# Patient Record
Sex: Male | Born: 1990 | Race: Black or African American | Hispanic: No | Marital: Single | Smoking: Current some day smoker
Health system: Southern US, Community
[De-identification: ages and names within clinical notes are randomized; demographics above are authoritative.]

---
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
## 2012-01-02 LAB — TB SKIN TEST

## 2015-05-29 ENCOUNTER — Ambulatory Visit (INDEPENDENT_AMBULATORY_CARE_PROVIDER_SITE_OTHER): Payer: Managed Care, Other (non HMO) | Admitting: Physician Assistant

## 2015-05-29 VITALS — BP 120/80 | HR 82 | Temp 98.8°F | Resp 16 | Ht 68.0 in | Wt 136.0 lb

## 2015-05-29 DIAGNOSIS — M25562 Pain in left knee: Secondary | ICD-10-CM

## 2015-05-29 DIAGNOSIS — M545 Low back pain, unspecified: Secondary | ICD-10-CM

## 2015-05-29 DIAGNOSIS — M25561 Pain in right knee: Secondary | ICD-10-CM

## 2015-05-29 NOTE — Progress Notes (Signed)
   Jeffery James  MRN: [id number] DOB: 1991-02-13  Subjective:  Pt presents to clinic with low back pain and bilateral knee pain.  He has had problems with his back and knees for a while but now he wants to make sure everything is ok.  He has never been seen for this before.  Yesterday he tried some tylenol yesterday and it did seem to help.  The pain is worse after he has worked but he wakes up without pain.  He is having no pain radiation and no paresthesias.  Over the last several days he has had some stomach cramping but it is intermittent and not related to anything including eating.  He is having no problems with constipation or gas.  Works in a [employer] and stands for 8h a day. - he has worked 13d in a row - not a lot of heavy lifting- he was supposed to work today and be off tomorrow but he felt like he needed  days to rest so he did not go to work today.  There are no active problems to display for this patient.   No current outpatient prescriptions on file prior to visit.   No current facility-administered medications on file prior to visit.    No Known Allergies  Review of Systems  Constitutional: Negative for fever and chills.  Musculoskeletal: Positive for back pain. Negative for gait problem.   Objective:  BP 120/80 mmHg  Pulse 82  Temp(Src) 98.8 F (37.1 C) (Oral)  Resp 16  Ht  (1.727 m)  Wt 136 lb (61.689 kg)  BMI 20.68 kg/m2  SpO2 98%  Physical Exam  Constitutional: He is oriented to person, place, and time and well-developed, well-nourished, and in no distress.  HENT:  Head: Normocephalic and atraumatic.  Right Ear: External ear normal.  Left Ear: External ear normal.  Eyes: Conjunctivae are normal.  Neck: Normal range of motion.  Pulmonary/Chest: Effort normal.  Abdominal: Soft. Normal appearance. There is no tenderness. There is no CVA tenderness.  Musculoskeletal:       Right knee: Normal.       Left knee: Normal.       Lumbar back: He  exhibits normal range of motion, no bony tenderness, no pain and no spasm.  Tight hamstrings  Neurological: He is alert and oriented to person, place, and time. He has normal sensation, normal strength and normal reflexes. He displays no weakness and normal reflexes. He has a normal Straight Leg Raise Test. Gait normal. Gait normal.  Skin: Skin is warm and dry.  Psychiatric: Mood, memory, affect and judgment normal.    Assessment and Plan :  Bilateral low back pain without sciatica  Bilateral knee pain   Pt has tight hamstrings but an otherwise normal exam.  We talked about good back health and ways to protect his back at work.  He will try and stretch his hamstrings to help protect his back.  I suspect that his abd discomfort is possibly related to a viral illness but he will be mindful of healthy eating and enough fluids and he will use a probiotic to help.  Benny Lennert PA-C  [facility] 05/29/2015 3:04 PM

## 2015-05-29 NOTE — Patient Instructions (Signed)
Probiotics to help with GI upset - watch milk products for the next week or so as this can give you some stomach upset after a possible stomach illness.  Hamstring stretches like we talked about to help protect your back  Low Back Sprain With Rehab A sprain is an injury in which a ligament is torn. The ligaments of the lower back are vulnerable to sprains. However, they are strong and require great force to be injured. These ligaments are important for stabilizing the spinal column. Sprains are classified into three categories. Grade 1 sprains cause pain, but the tendon is not lengthened. Grade 2 sprains include a lengthened ligament, due to the ligament being stretched or partially ruptured. With grade 2 sprains there is still function, although the function may be decreased. Grade 3 sprains involve a complete tear of the tendon or muscle, and function is usually impaired. SYMPTOMS   Severe pain in the lower back.  Sometimes, a feeling of a "pop," "snap," or tear, at the time of injury.  Tenderness and sometimes swelling at the injury site.  Uncommonly, bruising (contusion) within 48 hours of injury.  Muscle spasms in the back. CAUSES  Low back sprains occur when a force is placed on the ligaments that is greater than they can handle. Common causes of injury include:  Performing a stressful act while off-balance.  Repetitive stressful activities that involve movement of the lower back.  Direct hit (trauma) to the lower back. RISK INCREASES WITH:  Contact sports (football, wrestling).  Collisions (major skiing accidents).  Sports that require throwing or lifting (baseball, weightlifting).  Sports involving twisting of the spine (gymnastics, diving, tennis, golf).  Poor strength and flexibility.  Inadequate protection.  Previous back injury or surgery (especially fusion). PREVENTION  Wear properly fitted and padded protective equipment.  Warm up and stretch properly before  activity.  Allow for adequate recovery between workouts.  Maintain physical fitness:  Strength, flexibility, and endurance.  Cardiovascular fitness.  Maintain a healthy body weight. PROGNOSIS  If treated properly, low back sprains usually heal with non-surgical treatment. The length of time for healing depends on the severity of the injury.  RELATED COMPLICATIONS   Recurring symptoms, resulting in a chronic problem.  Chronic inflammation and pain in the low back.  Delayed healing or resolution of symptoms, especially if activity is resumed too soon.  Prolonged impairment.  Unstable or arthritic joints of the low back. TREATMENT  Treatment first involves the use of ice and medicine, to reduce pain and inflammation. The use of strengthening and stretching exercises may help reduce pain with activity. These exercises may be performed at home or with a therapist. Severe injuries may require referral to a therapist for further evaluation and treatment, such as ultrasound. Your caregiver may advise that you wear a back brace or corset, to help reduce pain and discomfort. Often, prolonged bed rest results in greater harm then benefit. Corticosteroid injections may be recommended. However, these should be reserved for the most serious cases. It is important to avoid using your back when lifting objects. At night, sleep on your back on a firm mattress, with a pillow placed under your knees. If non-surgical treatment is unsuccessful, surgery may be needed.  MEDICATION   If pain medicine is needed, nonsteroidal anti-inflammatory medicines (aspirin and ibuprofen), or other minor pain relievers (acetaminophen), are often advised.  Do not take pain medicine for 7 days before surgery.  Prescription pain relievers may be given, if your caregiver thinks they  are needed. Use only as directed and only as much as you need.  Ointments applied to the skin may be helpful.  Corticosteroid injections may  be given by your caregiver. These injections should be reserved for the most serious cases, because they may only be given a certain number of times. HEAT AND COLD  Cold treatment (icing) should be applied for 10 to 15 minutes every 2 to 3 hours for inflammation and pain, and immediately after activity that aggravates your symptoms. Use ice packs or an ice massage.  Heat treatment may be used before performing stretching and strengthening activities prescribed by your caregiver, physical therapist, or athletic trainer. Use a heat pack or a warm water soak. SEEK MEDICAL CARE IF:   Symptoms get worse or do not improve in 2 to 4 weeks, despite treatment.  You develop numbness or weakness in either leg.  You lose bowel or bladder function.  Any of the following occur after surgery: fever, increased pain, swelling, redness, drainage of fluids, or bleeding in the affected area.  New, unexplained symptoms develop. (Drugs used in treatment may produce side effects.) EXERCISES  RANGE OF MOTION (ROM) AND STRETCHING EXERCISES - Low Back Sprain Most people with lower back pain will find that their symptoms get worse with excessive bending forward (flexion) or arching at the lower back (extension). The exercises that will help resolve your symptoms will focus on the opposite motion.  Your physician, physical therapist or athletic trainer will help you determine which exercises will be most helpful to resolve your lower back pain. Do not complete any exercises without first consulting with your caregiver. Discontinue any exercises which make your symptoms worse, until you speak to your caregiver. If you have pain, numbness or tingling which travels down into your buttocks, leg or foot, the goal of the therapy is for these symptoms to move closer to your back and eventually resolve. Sometimes, these leg symptoms will get better, but your lower back pain may worsen. This is often an indication of progress in  your rehabilitation. Be very alert to any changes in your symptoms and the activities in which you participated in the 24 hours prior to the change. Sharing this information with your caregiver will allow him or her to most efficiently treat your condition. These exercises may help you when beginning to rehabilitate your injury. Your symptoms may resolve with or without further involvement from your physician, physical therapist or athletic trainer. While completing these exercises, remember:   Restoring tissue flexibility helps normal motion to return to the joints. This allows healthier, less painful movement and activity.  An effective stretch should be held for at least 30 seconds.  A stretch should never be painful. You should only feel a gentle lengthening or release in the stretched tissue. FLEXION RANGE OF MOTION AND STRETCHING EXERCISES: STRETCH - Flexion, Single Knee to Chest   Lie on a firm bed or floor with both legs extended in front of you.  Keeping one leg in contact with the floor, bring your opposite knee to your chest. Hold your leg in place by either grabbing behind your thigh or at your knee.  Pull until you feel a gentle stretch in your low back. Hold __________ seconds.  Slowly release your grasp and repeat the exercise with the opposite side. Repeat __________ times. Complete this exercise __________ times per day.  STRETCH - Flexion, Double Knee to Chest  Lie on a firm bed or floor with both legs extended  in front of you.  Keeping one leg in contact with the floor, bring your opposite knee to your chest.  Tense your stomach muscles to support your back and then lift your other knee to your chest. Hold your legs in place by either grabbing behind your thighs or at your knees.  Pull both knees toward your chest until you feel a gentle stretch in your low back. Hold __________ seconds.  Tense your stomach muscles and slowly return one leg at a time to the  floor. Repeat __________ times. Complete this exercise __________ times per day.  STRETCH - Low Trunk Rotation  Lie on a firm bed or floor. Keeping your legs in front of you, bend your knees so they are both pointed toward the ceiling and your feet are flat on the floor.  Extend your arms out to the side. This will stabilize your upper body by keeping your shoulders in contact with the floor.  Gently and slowly drop both knees together to one side until you feel a gentle stretch in your low back. Hold for __________ seconds.  Tense your stomach muscles to support your lower back as you bring your knees back to the starting position. Repeat the exercise to the other side. Repeat __________ times. Complete this exercise __________ times per day  EXTENSION RANGE OF MOTION AND FLEXIBILITY EXERCISES: STRETCH - Extension, Prone on Elbows   Lie on your stomach on the floor, a bed will be too soft. Place your palms about shoulder width apart and at the height of your head.  Place your elbows under your shoulders. If this is too painful, stack pillows under your chest.  Allow your body to relax so that your hips drop lower and make contact more completely with the floor.  Hold this position for __________ seconds.  Slowly return to lying flat on the floor. Repeat __________ times. Complete this exercise __________ times per day.  RANGE OF MOTION - Extension, Prone Press Ups  Lie on your stomach on the floor, a bed will be too soft. Place your palms about shoulder width apart and at the height of your head.  Keeping your back as relaxed as possible, slowly straighten your elbows while keeping your hips on the floor. You may adjust the placement of your hands to maximize your comfort. As you gain motion, your hands will come more underneath your shoulders.  Hold this position __________ seconds.  Slowly return to lying flat on the floor. Repeat __________ times. Complete this exercise  __________ times per day.  RANGE OF MOTION- Quadruped, Neutral Spine   Assume a hands and knees position on a firm surface. Keep your hands under your shoulders and your knees under your hips. You may place padding under your knees for comfort.  Drop your head and point your tailbone toward the ground below you. This will round out your lower back like an angry cat. Hold this position for __________ seconds.  Slowly lift your head and release your tail bone so that your back sags into a large arch, like an old horse.  Hold this position for __________ seconds.  Repeat this until you feel limber in your low back.  Now, find your "sweet spot." This will be the most comfortable position somewhere between the two previous positions. This is your neutral spine. Once you have found this position, tense your stomach muscles to support your low back.  Hold this position for __________ seconds. Repeat __________ times. Complete this exercise  __________ times per day.  STRENGTHENING EXERCISES - Low Back Sprain These exercises may help you when beginning to rehabilitate your injury. These exercises should be done near your "sweet spot." This is the neutral, low-back arch, somewhere between fully rounded and fully arched, that is your least painful position. When performed in this safe range of motion, these exercises can be used for people who have either a flexion or extension based injury. These exercises may resolve your symptoms with or without further involvement from your physician, physical therapist or athletic trainer. While completing these exercises, remember:   Muscles can gain both the endurance and the strength needed for everyday activities through controlled exercises.  Complete these exercises as instructed by your physician, physical therapist or athletic trainer. Increase the resistance and repetitions only as guided.  You may experience muscle soreness or fatigue, but the pain or  discomfort you are trying to eliminate should never worsen during these exercises. If this pain does worsen, stop and make certain you are following the directions exactly. If the pain is still present after adjustments, discontinue the exercise until you can discuss the trouble with your caregiver. STRENGTHENING - Deep Abdominals, Pelvic Tilt   Lie on a firm bed or floor. Keeping your legs in front of you, bend your knees so they are both pointed toward the ceiling and your feet are flat on the floor.  Tense your lower abdominal muscles to press your low back into the floor. This motion will rotate your pelvis so that your tail bone is scooping upwards rather than pointing at your feet or into the floor. With a gentle tension and even breathing, hold this position for __________ seconds. Repeat __________ times. Complete this exercise __________ times per day.  STRENGTHENING - Abdominals, Crunches   Lie on a firm bed or floor. Keeping your legs in front of you, bend your knees so they are both pointed toward the ceiling and your feet are flat on the floor. Cross your arms over your chest.  Slightly tip your chin down without bending your neck.  Tense your abdominals and slowly lift your trunk high enough to just clear your shoulder blades. Lifting higher can put excessive stress on the lower back and does not further strengthen your abdominal muscles.  Control your return to the starting position. Repeat __________ times. Complete this exercise __________ times per day.  STRENGTHENING - Quadruped, Opposite UE/LE Lift   Assume a hands and knees position on a firm surface. Keep your hands under your shoulders and your knees under your hips. You may place padding under your knees for comfort.  Find your neutral spine and gently tense your abdominal muscles so that you can maintain this position. Your shoulders and hips should form a rectangle that is parallel with the floor and is not  twisted.  Keeping your trunk steady, lift your right hand no higher than your shoulder and then your left leg no higher than your hip. Make sure you are not holding your breath. Hold this position for __________ seconds.  Continuing to keep your abdominal muscles tense and your back steady, slowly return to your starting position. Repeat with the opposite arm and leg. Repeat __________ times. Complete this exercise __________ times per day.  STRENGTHENING - Abdominals and Quadriceps, Straight Leg Raise   Lie on a firm bed or floor with both legs extended in front of you.  Keeping one leg in contact with the floor, bend the other knee so that  your foot can rest flat on the floor.  Find your neutral spine, and tense your abdominal muscles to maintain your spinal position throughout the exercise.  Slowly lift your straight leg off the floor about 6 inches for a count of 15, making sure to not hold your breath.  Still keeping your neutral spine, slowly lower your leg all the way to the floor. Repeat this exercise with each leg __________ times. Complete this exercise __________ times per day. POSTURE AND BODY MECHANICS CONSIDERATIONS - Low Back Sprain Keeping correct posture when sitting, standing or completing your activities will reduce the stress put on different body tissues, allowing injured tissues a chance to heal and limiting painful experiences. The following are general guidelines for improved posture. Your physician or physical therapist will provide you with any instructions specific to your needs. While reading these guidelines, remember:  The exercises prescribed by your provider will help you have the flexibility and strength to maintain correct postures.  The correct posture provides the best environment for your joints to work. All of your joints have less wear and tear when properly supported by a spine with good posture. This means you will experience a healthier, less painful  body.  Correct posture must be practiced with all of your activities, especially prolonged sitting and standing. Correct posture is as important when doing repetitive low-stress activities (typing) as it is when doing a single heavy-load activity (lifting). RESTING POSITIONS Consider which positions are most painful for you when choosing a resting position. If you have pain with flexion-based activities (sitting, bending, stooping, squatting), choose a position that allows you to rest in a less flexed posture. You would want to avoid curling into a fetal position on your side. If your pain worsens with extension-based activities (prolonged standing, working overhead), avoid resting in an extended position such as sleeping on your stomach. Most people will find more comfort when they rest with their spine in a more neutral position, neither too rounded nor too arched. Lying on a non-sagging bed on your side with a pillow between your knees, or on your back with a pillow under your knees will often provide some relief. Keep in mind, being in any one position for a prolonged period of time, no matter how correct your posture, can still lead to stiffness. PROPER SITTING POSTURE In order to minimize stress and discomfort on your spine, you must sit with correct posture. Sitting with good posture should be effortless for a healthy body. Returning to good posture is a gradual process. Many people can work toward this most comfortably by using various supports until they have the flexibility and strength to maintain this posture on their own. When sitting with proper posture, your ears will fall over your shoulders and your shoulders will fall over your hips. You should use the back of the chair to support your upper back. Your lower back will be in a neutral position, just slightly arched. You may place a small pillow or folded towel at the base of your lower back for  support.  When working at a desk, create an  environment that supports good, upright posture. Without extra support, muscles tire, which leads to excessive strain on joints and other tissues. Keep these recommendations in mind: CHAIR:  A chair should be able to slide under your desk when your back makes contact with the back of the chair. This allows you to work closely.  The chair's height should allow your eyes to be  level with the upper part of your monitor and your hands to be slightly lower than your elbows. BODY POSITION  Your feet should make contact with the floor. If this is not possible, use a foot rest.  Keep your ears over your shoulders. This will reduce stress on your neck and low back. INCORRECT SITTING POSTURES  If you are feeling tired and unable to assume a healthy sitting posture, do not slouch or slump. This puts excessive strain on your back tissues, causing more damage and pain. Healthier options include:  Using more support, like a lumbar pillow.  Switching tasks to something that requires you to be upright or walking.  Talking a brief walk.  Lying down to rest in a neutral-spine position. PROLONGED STANDING WHILE SLIGHTLY LEANING FORWARD  When completing a task that requires you to lean forward while standing in one place for a long time, place either foot up on a stationary 2-4 inch high object to help maintain the best posture. When both feet are on the ground, the lower back tends to lose its slight inward curve. If this curve flattens (or becomes too large), then the back and your other joints will experience too much stress, tire more quickly, and can cause pain. CORRECT STANDING POSTURES Proper standing posture should be assumed with all daily activities, even if they only take a few moments, like when brushing your teeth. As in sitting, your ears should fall over your shoulders and your shoulders should fall over your hips. You should keep a slight tension in your abdominal muscles to brace your spine.  Your tailbone should point down to the ground, not behind your body, resulting in an over-extended swayback posture.  INCORRECT STANDING POSTURES  Common incorrect standing postures include a forward head, locked knees and/or an excessive swayback. WALKING Walk with an upright posture. Your ears, shoulders and hips should all line-up. PROLONGED ACTIVITY IN A FLEXED POSITION When completing a task that requires you to bend forward at your waist or lean over a low surface, try to find a way to stabilize 3 out of 4 of your limbs. You can place a hand or elbow on your thigh or rest a knee on the surface you are reaching across. This will provide you more stability, so that your muscles do not tire as quickly. By keeping your knees relaxed, or slightly bent, you will also reduce stress across your lower back. CORRECT LIFTING TECHNIQUES DO :  Assume a wide stance. This will provide you more stability and the opportunity to get as close as possible to the object which you are lifting.  Tense your abdominals to brace your spine. Bend at the knees and hips. Keeping your back locked in a neutral-spine position, lift using your leg muscles. Lift with your legs, keeping your back straight.  Test the weight of unknown objects before attempting to lift them.  Try to keep your elbows locked down at your sides in order get the best strength from your shoulders when carrying an object.  Always ask for help when lifting heavy or awkward objects. INCORRECT LIFTING TECHNIQUES DO NOT:   Lock your knees when lifting, even if it is a small object.  Bend and twist. Pivot at your feet or move your feet when needing to change directions.  Assume that you can safely pick up even a paperclip without proper posture.   This information is not intended to replace advice given to you by your health care provider. Make  sure you discuss any questions you have with your health care provider.   Document Released:  04/24/2005 Document Revised: 05/15/2014 Document Reviewed: 08/06/2008 [employer] Patient Education [employer]

## 2015-06-10 ENCOUNTER — Ambulatory Visit (INDEPENDENT_AMBULATORY_CARE_PROVIDER_SITE_OTHER): Payer: Managed Care, Other (non HMO) | Admitting: Family Medicine

## 2015-06-10 VITALS — BP 118/68 | HR 95 | Temp 98.2°F | Resp 18 | Ht 70.0 in | Wt 130.4 lb

## 2015-06-10 DIAGNOSIS — K529 Noninfective gastroenteritis and colitis, unspecified: Secondary | ICD-10-CM

## 2015-06-10 NOTE — Patient Instructions (Signed)
No further testing needed today as you are improving, but if fever or worsening symptoms, return for recheck. Make sure you're drinking plenty of fluids, and bland foods for the next day or 2, then increase diet as tolerated.  Return to the clinic or go to the nearest emergency room if any of your symptoms worsen or new symptoms occur.   Gastroenteritis:  Diarrhea Infections caused by germs (bacterial) or a virus commonly cause diarrhea. Your caregiver has determined that with time, rest and fluids, the diarrhea should improve. In general, eat normally while drinking more water than usual. Although water may prevent dehydration, it does not contain salt and minerals (electrolytes). Broths, weak tea without caffeine and oral rehydration solutions (ORS) replace fluids and electrolytes. Small amounts of fluids should be taken frequently. Large amounts at one time may not be tolerated. Plain water may be harmful in infants and the elderly. Oral rehydrating solutions (ORS) are available at pharmacies and grocery stores. ORS replace water and important electrolytes in proper proportions. Sports drinks are not as effective as ORS and may be harmful due to sugars worsening diarrhea.  ORS is especially recommended for use in children with diarrhea. As a general guideline for children, replace any new fluid losses from diarrhea and/or vomiting with ORS as follows:   If your child weighs 22 pounds or under (10 kg or less), give 60-120 mL ( -  cup or 2 - 4 ounces) of ORS for each episode of diarrheal stool or vomiting episode.   If your child weighs more than 22 pounds (more than 10 kgs), give 120-240 mL ( - 1 cup or 4 - 8 ounces) of ORS for each diarrheal stool or episode of vomiting.   While correcting for dehydration, children should eat normally. However, foods high in sugar should be avoided because this may worsen diarrhea. Large amounts of carbonated soft drinks, juice, gelatin desserts and other  highly sugared drinks should be avoided.   After correction of dehydration, other liquids that are appealing to the child may be added. Children should drink small amounts of fluids frequently and fluids should be increased as tolerated. Children should drink enough fluids to keep urine clear or pale yellow.   Adults should eat normally while drinking more fluids than usual. Drink small amounts of fluids frequently and increase as tolerated. Drink enough fluids to keep urine clear or pale yellow. Broths, weak decaffeinated tea, lemon lime soft drinks (allowed to go flat) and ORS replace fluids and electrolytes.   Avoid:   Carbonated drinks.   Juice.   Extremely hot or cold fluids.   Caffeine drinks.   Fatty, greasy foods.   Alcohol.   Tobacco.   Too much intake of anything at one time.   Gelatin desserts.   Probiotics are active cultures of beneficial bacteria. They may lessen the amount and number of diarrheal stools in adults. Probiotics can be found in yogurt with active cultures and in supplements.   Wash hands well to avoid spreading bacteria and virus.   Anti-diarrheal medications are not recommended for infants and children.   Only take over-the-counter or prescription medicines for pain, discomfort or fever as directed by your caregiver. Do not give aspirin to children because it may cause Reye's Syndrome.   For adults, ask your caregiver if you should continue all prescribed and over-the-counter medicines.   If your caregiver has given you a follow-up appointment, it is very important to keep that appointment. Not keeping the appointment  could result in a chronic or permanent injury, and disability. If there is any problem keeping the appointment, you must call back to this facility for assistance.  SEEK IMMEDIATE MEDICAL CARE IF:   You or your child is unable to keep fluids down or other symptoms or problems become worse in spite of treatment.   Vomiting or diarrhea  develops and becomes persistent.   There is vomiting of blood or bile (green material).   There is blood in the stool or the stools are black and tarry.   There is no urine output in 6-8 hours or there is only a small amount of very dark urine.   Abdominal pain develops, increases or localizes.   You have a fever.   Your baby is older than 3 months with a rectal temperature of 102 F (38.9 C) or higher.   Your baby is 61 months old or younger with a rectal temperature of 100.4 F (38 C) or higher.   You or your child develops excessive weakness, dizziness, fainting or extreme thirst.   You or your child develops a rash, stiff neck, severe headache or become irritable or sleepy and difficult to awaken.  MAKE SURE YOU:   Understand these instructions.   Will watch your condition.   Will get help right away if you are not doing well or get worse.  Document Released: 04/14/2002 Document Revised: 04/13/2011 Document Reviewed: 03/01/2009 [facility] Patient Information 2012 [place], [place].  Nausea and Vomiting Nausea is a sick feeling that often comes before throwing up (vomiting). Vomiting is a reflex where stomach contents come out of your mouth. Vomiting can cause severe loss of body fluids (dehydration). Children and elderly adults can become dehydrated quickly, especially if they also have diarrhea. Nausea and vomiting are symptoms of a condition or disease. It is important to find the cause of your symptoms. CAUSES   Direct irritation of the stomach lining. This irritation can result from increased acid production (gastroesophageal reflux disease), infection, food poisoning, taking certain medicines (such as nonsteroidal anti-inflammatory drugs), alcohol use, or tobacco use.   Signals from the brain.These signals could be caused by a headache, heat exposure, an inner ear disturbance, increased pressure in the brain from injury, infection, a tumor, or a concussion, pain, emotional  stimulus, or metabolic problems.   An obstruction in the gastrointestinal tract (bowel obstruction).   Illnesses such as diabetes, hepatitis, gallbladder problems, appendicitis, kidney problems, cancer, sepsis, atypical symptoms of a heart attack, or eating disorders.   Medical treatments such as chemotherapy and radiation.   Receiving medicine that makes you sleep (general anesthetic) during surgery.  DIAGNOSIS Your caregiver may ask for tests to be done if the problems do not improve after a few days. Tests may also be done if symptoms are severe or if the reason for the nausea and vomiting is not clear. Tests may include:  Urine tests.   Blood tests.   Stool tests.   Cultures (to look for evidence of infection).   X-rays or other imaging studies.  Test results can help your caregiver make decisions about treatment or the need for additional tests. TREATMENT You need to stay well hydrated. Drink frequently but in small amounts.You may wish to drink water, sports drinks, clear broth, or eat frozen ice pops or gelatin dessert to help stay hydrated.When you eat, eating slowly may help prevent nausea.There are also some antinausea medicines that may help prevent nausea. HOME CARE INSTRUCTIONS   Take all medicine  as directed by your caregiver.   If you do not have an appetite, do not force yourself to eat. However, you must continue to drink fluids.   If you have an appetite, eat a normal diet unless your caregiver tells you differently.   Eat a variety of complex carbohydrates (rice, wheat, potatoes, bread), lean meats, yogurt, fruits, and vegetables.   Avoid high-fat foods because they are more difficult to digest.   Drink enough water and fluids to keep your urine clear or pale yellow.   If you are dehydrated, ask your caregiver for specific rehydration instructions. Signs of dehydration may include:   Severe thirst.   Dry lips and mouth.   Dizziness.   Dark urine.    Decreasing urine frequency and amount.   Confusion.   Rapid breathing or pulse.  SEEK IMMEDIATE MEDICAL CARE IF:   You have blood or brown flecks (like coffee grounds) in your vomit.   You have black or bloody stools.   You have a severe headache or stiff neck.   You are confused.   You have severe abdominal pain.   You have chest pain or trouble breathing.   You do not urinate at least once every 8 hours.   You develop cold or clammy skin.   You continue to vomit for longer than 24 to 48 hours.   You have a fever.  MAKE SURE YOU:   Understand these instructions.   Will watch your condition.   Will get help right away if you are not doing well or get worse.  Document Released: 04/24/2005 Document Revised: 04/13/2011 Document Reviewed: 09/21/2010 [facility] Patient Information 2012 [place], [place].  Return to the clinic or go to the nearest emergency room if any of your symptoms worsen or new symptoms occur.

## 2015-06-10 NOTE — Progress Notes (Addendum)
Subjective:  This chart was scribed for Jeffery Staggers MD, by Jeffery James, at [facility].  This patient was seen in room 12 and the patient's care was started at 10:10 AM.    Patient ID: Jeffery James, male    DOB: 1991/01/07, 25 y.o.   MRN: [id number] Chief Complaint  Patient presents with  . Abdominal Pain    Upset stomach after eating food from [employer] on Tuesday.     HPI HPI Comments: Jeffery James is a 25 y.o. male who presents to the [facility] complaining of abdominal pain onset two days ago after he ate a smoked sausage biscuit at [employer].  Patient was also told that there was a stomach virus going around at work so he is unsure of the exact cause of his symptoms.   Patient went to work two days ago but was only able to stay for three hours as he felt very nauseous.  When he got home, he had symptoms of diarrhea, nausea and aching joints.  He immediately went to sleep and woke up feeling slightly better but still had nausea.   He denies any diarrhea yesterday or today but did have nausea yesterday after eating [employer] wings for dinner. He was able to drink water and ginger ale with ease yesterday.  He states that he woke up this morning feeling "good" but still has aching joints.  He has not eaten today.  Patient denies a fever/chills, abnormal urinary symptoms.  He did have rhinorrhea recently and thinks that he may have gotten it from his daughter who was also sick at home. He has not taken any medication to alleviate his symptoms.  Patient works in a [employer].      There are no active problems to display for this patient.  History reviewed. No pertinent past medical history. History reviewed. No pertinent past surgical history. No Known Allergies Prior to Admission medications   Not on File   Social History   Social History  . Marital Status: Single    Spouse Name: N/A  . Number of Children: N/A  . Years of Education: N/A    Occupational History  . Not on file.   Social History Main Topics  . Smoking status: Never Smoker   . Smokeless tobacco: Never Used  . Alcohol Use: No  . Drug Use: No  . Sexual Activity: No   Other Topics Concern  . Not on file   Social History Narrative   Works in a warehouse      Review of Systems  Constitutional: Negative for fever and chills.  HENT: Positive for rhinorrhea.   Eyes: Negative for pain, redness and itching.  Respiratory: Negative for cough and choking.   Gastrointestinal: Positive for nausea, abdominal pain and diarrhea. Negative for vomiting.  Musculoskeletal: Positive for myalgias. Negative for neck pain and neck stiffness.  Neurological: Negative for seizures, syncope and speech difficulty.       Objective:   Physical Exam  Constitutional: He is oriented to person, place, and time. He appears well-developed and well-nourished.  HENT:  Head: Normocephalic and atraumatic.  Right Ear: Tympanic membrane, external ear and ear canal normal.  Left Ear: Tympanic membrane, external ear and ear canal normal.  Nose: No rhinorrhea.  Mouth/Throat: Oropharynx is clear and moist and mucous membranes are normal. No oropharyngeal exudate or posterior oropharyngeal erythema.  Moist oral mucosa.   Eyes: Conjunctivae are normal. Pupils are equal, round,  and reactive to light.  Neck: Neck supple.  Cardiovascular: Normal rate, regular rhythm, normal heart sounds and intact distal pulses.   No murmur heard. Pulmonary/Chest: Effort normal and breath sounds normal. He has no wheezes. He has no rhonchi. He has no rales.  Abdominal: Soft. He exhibits no distension. There is no tenderness.  Slightly hyperactive bowel sounds but normal otherwise.  Musculoskeletal:     Lymphadenopathy:    He has no cervical adenopathy.  Neurological: He is alert and oriented to person, place, and time.  Skin: Skin is warm and dry. No rash noted.  Psychiatric: He has a normal mood and  affect. His behavior is normal.  Vitals reviewed.   Filed Vitals:   06/10/15 1002  BP: 118/68  Pulse: 95  Temp: 98.2 F (36.8 C)  TempSrc: Oral  Resp: 18  Height: 5\' 10"  (1.778 m)  Weight: 130 lb 6.4 oz (59.149 kg)  SpO2: 99%       Assessment & Plan:  [name] is a 25 y.o. male Noninfectious gastroenteritis, unspecified  Suspected viral syndrome/viral gastroenteritis with sick contacts at work. Now improved, well-hydrated, no further testing needed at this time. Symptomatic care discussed with fluids, bland foods, Pepto-Bismol or Imodium over-the-counter if needed, and handout provided. Work note given for the past 2 days, returning today. RTC precautions if worsening.   No orders of the defined types were placed in this encounter.   Patient Instructions  No further testing needed today as you are improving, but if fever or worsening symptoms, return for recheck. Make sure you're drinking plenty of fluids, and bland foods for the next day or 2, then increase diet as tolerated.  Return to the clinic or go to the nearest emergency room if any of your symptoms worsen or new symptoms occur.   Gastroenteritis:  Diarrhea Infections caused by germs (bacterial) or a virus commonly cause diarrhea. Your caregiver has determined that with time, rest and fluids, the diarrhea should improve. In general, eat normally while drinking more water than usual. Although water may prevent dehydration, it does not contain salt and minerals (electrolytes). Broths, weak tea without caffeine and oral rehydration solutions (ORS) replace fluids and electrolytes. Small amounts of fluids should be taken frequently. Large amounts at one time may not be tolerated. Plain water may be harmful in infants and the elderly. Oral rehydrating solutions (ORS) are available at pharmacies and grocery stores. ORS replace water and important electrolytes in proper proportions. Sports drinks are not as effective as ORS  and may be harmful due to sugars worsening diarrhea.  ORS is especially recommended for use in children with diarrhea. As a general guideline for children, replace any new fluid losses from diarrhea and/or vomiting with ORS as follows:   If your child weighs 22 pounds or under (10 kg or less), give 60-120 mL ( -  cup or 2 - 4 ounces) of ORS for each episode of diarrheal stool or vomiting episode.   If your child weighs more than 22 pounds (more than 10 kgs), give 120-240 mL ( - 1 cup or 4 - 8 ounces) of ORS for each diarrheal stool or episode of vomiting.   While correcting for dehydration, children should eat normally. However, foods high in sugar should be avoided because this may worsen diarrhea. Large amounts of carbonated soft drinks, juice, gelatin desserts and other highly sugared drinks should be avoided.   After correction of dehydration, other liquids that are appealing to the child  may be added. Children should drink small amounts of fluids frequently and fluids should be increased as tolerated. Children should drink enough fluids to keep urine clear or pale yellow.   Adults should eat normally while drinking more fluids than usual. Drink small amounts of fluids frequently and increase as tolerated. Drink enough fluids to keep urine clear or pale yellow. Broths, weak decaffeinated tea, lemon lime soft drinks (allowed to go flat) and ORS replace fluids and electrolytes.   Avoid:   Carbonated drinks.   Juice.   Extremely hot or cold fluids.   Caffeine drinks.   Fatty, greasy foods.   Alcohol.   Tobacco.   Too much intake of anything at one time.   Gelatin desserts.   Probiotics are active cultures of beneficial bacteria. They may lessen the amount and number of diarrheal stools in adults. Probiotics can be found in yogurt with active cultures and in supplements.   Wash hands well to avoid spreading bacteria and virus.   Anti-diarrheal medications are not recommended  for infants and children.   Only take over-the-counter or prescription medicines for pain, discomfort or fever as directed by your caregiver. Do not give aspirin to children because it may cause Reye's Syndrome.   For adults, ask your caregiver if you should continue all prescribed and over-the-counter medicines.   If your caregiver has given you a follow-up appointment, it is very important to keep that appointment. Not keeping the appointment could result in a chronic or permanent injury, and disability. If there is any problem keeping the appointment, you must call back to this facility for assistance.  SEEK IMMEDIATE MEDICAL CARE IF:   You or your child is unable to keep fluids down or other symptoms or problems become worse in spite of treatment.   Vomiting or diarrhea develops and becomes persistent.   There is vomiting of blood or bile (green material).   There is blood in the stool or the stools are black and tarry.   There is no urine output in 6-8 hours or there is only a small amount of very dark urine.   Abdominal pain develops, increases or localizes.   You have a fever.   Your baby is older than 3 months with a rectal temperature of 102 F (38.9 C) or higher.   Your baby is 34 months old or younger with a rectal temperature of 100.4 F (38 C) or higher.   You or your child develops excessive weakness, dizziness, fainting or extreme thirst.   You or your child develops a rash, stiff neck, severe headache or become irritable or sleepy and difficult to awaken.  MAKE SURE YOU:   Understand these instructions.   Will watch your condition.   Will get help right away if you are not doing well or get worse.  Document Released: 04/14/2002 Document Revised: 04/13/2011 Document Reviewed: 03/01/2009 [facility] Patient Information 2012 [place], [place].  Nausea and Vomiting Nausea is a sick feeling that often comes before throwing up (vomiting). Vomiting is a reflex where  stomach contents come out of your mouth. Vomiting can cause severe loss of body fluids (dehydration). Children and elderly adults can become dehydrated quickly, especially if they also have diarrhea. Nausea and vomiting are symptoms of a condition or disease. It is important to find the cause of your symptoms. CAUSES   Direct irritation of the stomach lining. This irritation can result from increased acid production (gastroesophageal reflux disease), infection, food poisoning, taking certain medicines (such  as nonsteroidal anti-inflammatory drugs), alcohol use, or tobacco use.   Signals from the brain.These signals could be caused by a headache, heat exposure, an inner ear disturbance, increased pressure in the brain from injury, infection, a tumor, or a concussion, pain, emotional stimulus, or metabolic problems.   An obstruction in the gastrointestinal tract (bowel obstruction).   Illnesses such as diabetes, hepatitis, gallbladder problems, appendicitis, kidney problems, cancer, sepsis, atypical symptoms of a heart attack, or eating disorders.   Medical treatments such as chemotherapy and radiation.   Receiving medicine that makes you sleep (general anesthetic) during surgery.  DIAGNOSIS Your caregiver may ask for tests to be done if the problems do not improve after a few days. Tests may also be done if symptoms are severe or if the reason for the nausea and vomiting is not clear. Tests may include:  Urine tests.   Blood tests.   Stool tests.   Cultures (to look for evidence of infection).   X-rays or other imaging studies.  Test results can help your caregiver make decisions about treatment or the need for additional tests. TREATMENT You need to stay well hydrated. Drink frequently but in small amounts.You may wish to drink water, sports drinks, clear broth, or eat frozen ice pops or gelatin dessert to help stay hydrated.When you eat, eating slowly may help prevent nausea.There  are also some antinausea medicines that may help prevent nausea. HOME CARE INSTRUCTIONS   Take all medicine as directed by your caregiver.   If you do not have an appetite, do not force yourself to eat. However, you must continue to drink fluids.   If you have an appetite, eat a normal diet unless your caregiver tells you differently.   Eat a variety of complex carbohydrates (rice, wheat, potatoes, bread), lean meats, yogurt, fruits, and vegetables.   Avoid high-fat foods because they are more difficult to digest.   Drink enough water and fluids to keep your urine clear or pale yellow.   If you are dehydrated, ask your caregiver for specific rehydration instructions. Signs of dehydration may include:   Severe thirst.   Dry lips and mouth.   Dizziness.   Dark urine.   Decreasing urine frequency and amount.   Confusion.   Rapid breathing or pulse.  SEEK IMMEDIATE MEDICAL CARE IF:   You have blood or brown flecks (like coffee grounds) in your vomit.   You have black or bloody stools.   You have a severe headache or stiff neck.   You are confused.   You have severe abdominal pain.   You have chest pain or trouble breathing.   You do not urinate at least once every 8 hours.   You develop cold or clammy skin.   You continue to vomit for longer than 24 to 48 hours.   You have a fever.  MAKE SURE YOU:   Understand these instructions.   Will watch your condition.   Will get help right away if you are not doing well or get worse.  Document Released: 04/24/2005 Document Revised: 04/13/2011 Document Reviewed: 09/21/2010 [facility] Patient Information 2012 [place], [place].  Return to the clinic or go to the nearest emergency room if any of your symptoms worsen or new symptoms occur.     I personally performed the services described in this documentation, which was scribed in my presence. The recorded information has been reviewed and considered, and addended by me  as needed.

## 2015-06-17 ENCOUNTER — Ambulatory Visit (INDEPENDENT_AMBULATORY_CARE_PROVIDER_SITE_OTHER): Payer: Managed Care, Other (non HMO) | Admitting: Family Medicine

## 2015-06-17 ENCOUNTER — Ambulatory Visit (INDEPENDENT_AMBULATORY_CARE_PROVIDER_SITE_OTHER): Payer: Managed Care, Other (non HMO)

## 2015-06-17 VITALS — BP 108/68 | HR 65 | Temp 99.2°F | Resp 16 | Ht 68.0 in | Wt 134.0 lb

## 2015-06-17 DIAGNOSIS — M79641 Pain in right hand: Secondary | ICD-10-CM | POA: Diagnosis not present

## 2015-06-17 MED ORDER — TRAMADOL HCL 50 MG PO TABS: 50.0000 mg | ORAL_TABLET | Freq: Three times a day (TID) | ORAL | Status: AC | PRN

## 2015-06-17 NOTE — Patient Instructions (Addendum)
X-rays are negative for fracture. You have a bone bruise instead. This should be uncomfortable for 2 or 3 days and then gradually resolve.   Because you received an x-ray today, you will receive an invoice from [facility]. Please contact [facility] at [phone] with questions or concerns regarding your invoice. Our billing staff will not be able to assist you with those questions.

## 2015-06-17 NOTE — Progress Notes (Signed)
By signing my name below, I, Rawaa Al Rifaie, attest that this documentation has been prepared under the direction and in the presence of Elvina Sidle, MD.  Watt Climes Rifaie, Medical Scribe. 06/17/2015.  11:58 AM.      Patient ID: Jeffery James MRN: [id number], DOB: [birth year], 25 y.o. Date of Encounter: 06/17/2015  Primary Physician: No primary care provider on file.  Chief Complaint:  Chief Complaint  Patient presents with   Hand Injury    right, since yesterday    HPI:  Jeffery James is a 25 y.o. male who presents to [facility] complaining of a right hand injury that occurred yesterday at midnight.  Pt reports that as he was playing with his daughter, he threw his hand and hit it with a wall. He indicates that the pain is severe on the knuckles to the upper wrist area. Pt is right-handed dominant.   Pt works at [employer] and [employer].   History reviewed. No pertinent past medical history.   Home Meds: Prior to Admission medications   Not on File    Allergies: No Known Allergies  Social History   Social History   Marital Status: Single    Spouse Name: N/A   Number of Children: N/A   Years of Education: N/A   Occupational History   Not on file.   Social History Main Topics   Smoking status: Current Some Day Smoker   Smokeless tobacco: Never Used   Alcohol Use: No   Drug Use: No   Sexual Activity: No   Other Topics Concern   Not on file   Social History Narrative   Works in a warehouse    Review of Systems: Constitutional: negative for chills, fever, night sweats, weight changes, or fatigue  HEENT: negative for vision changes, hearing loss, congestion, rhinorrhea, ST, epistaxis, or sinus pressure Cardiovascular: negative for chest pain or palpitations Respiratory: negative for hemoptysis, wheezing, shortness of breath, or cough Abdominal: negative for abdominal pain, nausea, vomiting, diarrhea, or  constipation Dermatological: negative for rash Neurologic: negative for headache, dizziness, or syncope Msk: Positive for arthralgia. All other systems reviewed and are otherwise negative with the exception to those above and in the HPI.  Physical Exam: Blood pressure 108/68, pulse 65, temperature 99.2 F (37.3 C), resp. rate 16, height  (1.727 m), weight 134 lb (60.782 kg), SpO2 96 %., Body mass index is 20.38 kg/(m^2). General: Well developed, well nourished, in no acute distress. Head: Normocephalic, atraumatic, eyes without discharge, sclera non-icteric, nares are without discharge. Bilateral auditory canals clear, TM's are without perforation, pearly grey and translucent with reflective cone of light bilaterally. Oral cavity moist, posterior pharynx without exudate, erythema, peritonsillar abscess, or post nasal drip.  Neck: Supple. No thyromegaly. Full ROM. No lymphadenopathy. Lungs: Clear bilaterally to auscultation without wheezes, rales, or rhonchi. Breathing is unlabored. Heart: RRR with S1 S2. No murmurs, rubs, or gallops appreciated. Msk:  Strength and tone normal for age.  Extremities/Skin: Warm and dry. No clubbing or cyanosis. No edema. No rashes or suspicious lesions. Neuro: Alert and oriented X 3. Moves all extremities spontaneously. Gait is normal. CNII-XII grossly in tact. Psych:  Responds to questions appropriately with a normal affect.   Labs:  [facility] (PRIMARY) x-ray report read by Dr.Kurt Lauenstein, MD: Right hand- No abnormalities seen.   ASSESSMENT AND PLAN:  26 y.o. year old male with hand contusion  This chart was scribed in my presence and reviewed  by me personally.    ICD-9-CM ICD-10-CM   1. Right hand pain 729.5 M79.641 DG Hand Complete Right     traMADol (ULTRAM) 50 MG tablet   Ace wrap for protection. Signed, Elvina Sidle, MD 06/17/2015 11:54 AM

## 2015-07-12 ENCOUNTER — Ambulatory Visit (INDEPENDENT_AMBULATORY_CARE_PROVIDER_SITE_OTHER): Payer: Managed Care, Other (non HMO) | Admitting: Family Medicine

## 2015-07-12 ENCOUNTER — Ambulatory Visit (INDEPENDENT_AMBULATORY_CARE_PROVIDER_SITE_OTHER): Payer: Managed Care, Other (non HMO)

## 2015-07-12 VITALS — BP 110/72 | HR 77 | Temp 98.3°F | Resp 20 | Ht 69.69 in | Wt 143.2 lb

## 2015-07-12 DIAGNOSIS — S60221D Contusion of right hand, subsequent encounter: Secondary | ICD-10-CM

## 2015-07-12 DIAGNOSIS — M79641 Pain in right hand: Secondary | ICD-10-CM

## 2015-07-12 NOTE — Progress Notes (Signed)
Patient ID: Jeffery James, male    DOB: 01/09/1991  Age: 25 y.o. MRN: [id number]  Chief Complaint  Patient presents with  . Other    clearance for  right hand      Subjective:   Patient was here a month ago after injuring his right hand. X-ray was negative. He gradually resolved, but then he was moving last week and reinjured it a little bit. He has been hurting in the lateral aspect of the right hand, but it has now improved. He wants to be sure it is safe for him to go back to work. He does case picking and lifting of boxes. He last worked Wednesday.  Current allergies, medications, problem list, past/family and social histories reviewed.  Objective:  BP 110/72 mmHg  Pulse 77  Temp(Src) 98.3 F (36.8 C) (Oral)  Resp 20  Ht 5' 9.69" (1.77 m)  Wt 143 lb 3.2 oz (64.955 kg)  BMI 20.73 kg/m2  SpO2 98%  No major distress. Neurovascularly intact. Grip is fairly good with the right hand. A little bit tender along the metacarpal fifth. I feel like I can feel a little bony irregularity there still want to be certain.  Assessment & Plan:   Assessment: 1. Hand pain, right   2. Hand contusion, right, subsequent encounter       Plan: We will re-x-ray the hand  Orders Placed This Encounter  Procedures  . DG Hand Complete Right    Order Specific Question:  Reason for Exam (SYMPTOM  OR DIAGNOSIS REQUIRED)    Answer:  pain 5th metacarpal    Order Specific Question:  Preferred imaging location?    Answer:  External    No orders of the defined types were placed in this encounter.    X-rayed his still not reveal any fractures. Will allow the patient to return to work.     Patient Instructions  Continue being a little cautious with the use of the right hand, but I believe it is safe for you to return to regular duty.   Return as needed   Because you received an x-ray today, you will receive [facility] invoice from [facility]. Please contact [facility] at  [phone] with questions or concerns regarding your invoice. Our billing staff will not be able to assist you with those questions.      No Follow-up on file.   Fancy Dunkley, MD 07/12/2015

## 2015-07-12 NOTE — Patient Instructions (Addendum)
Continue being a little cautious with the use of the right hand, but I believe it is safe for you to return to regular duty.   Return as needed   Because you received an x-ray today, you will receive an invoice from [facility]. Please contact [facility] at [phone] with questions or concerns regarding your invoice. Our billing staff will not be able to assist you with those questions.

## 2017-06-22 IMAGING — CR DG HAND COMPLETE 3+V*R*
3 series · 3 of 3 positions shown · non-contrast
Comparison: 06/17/2015

CLINICAL DATA: Right hand pain fifth metacarpal.

EXAM:
RIGHT HAND - COMPLETE 3+ VIEW

[PA]
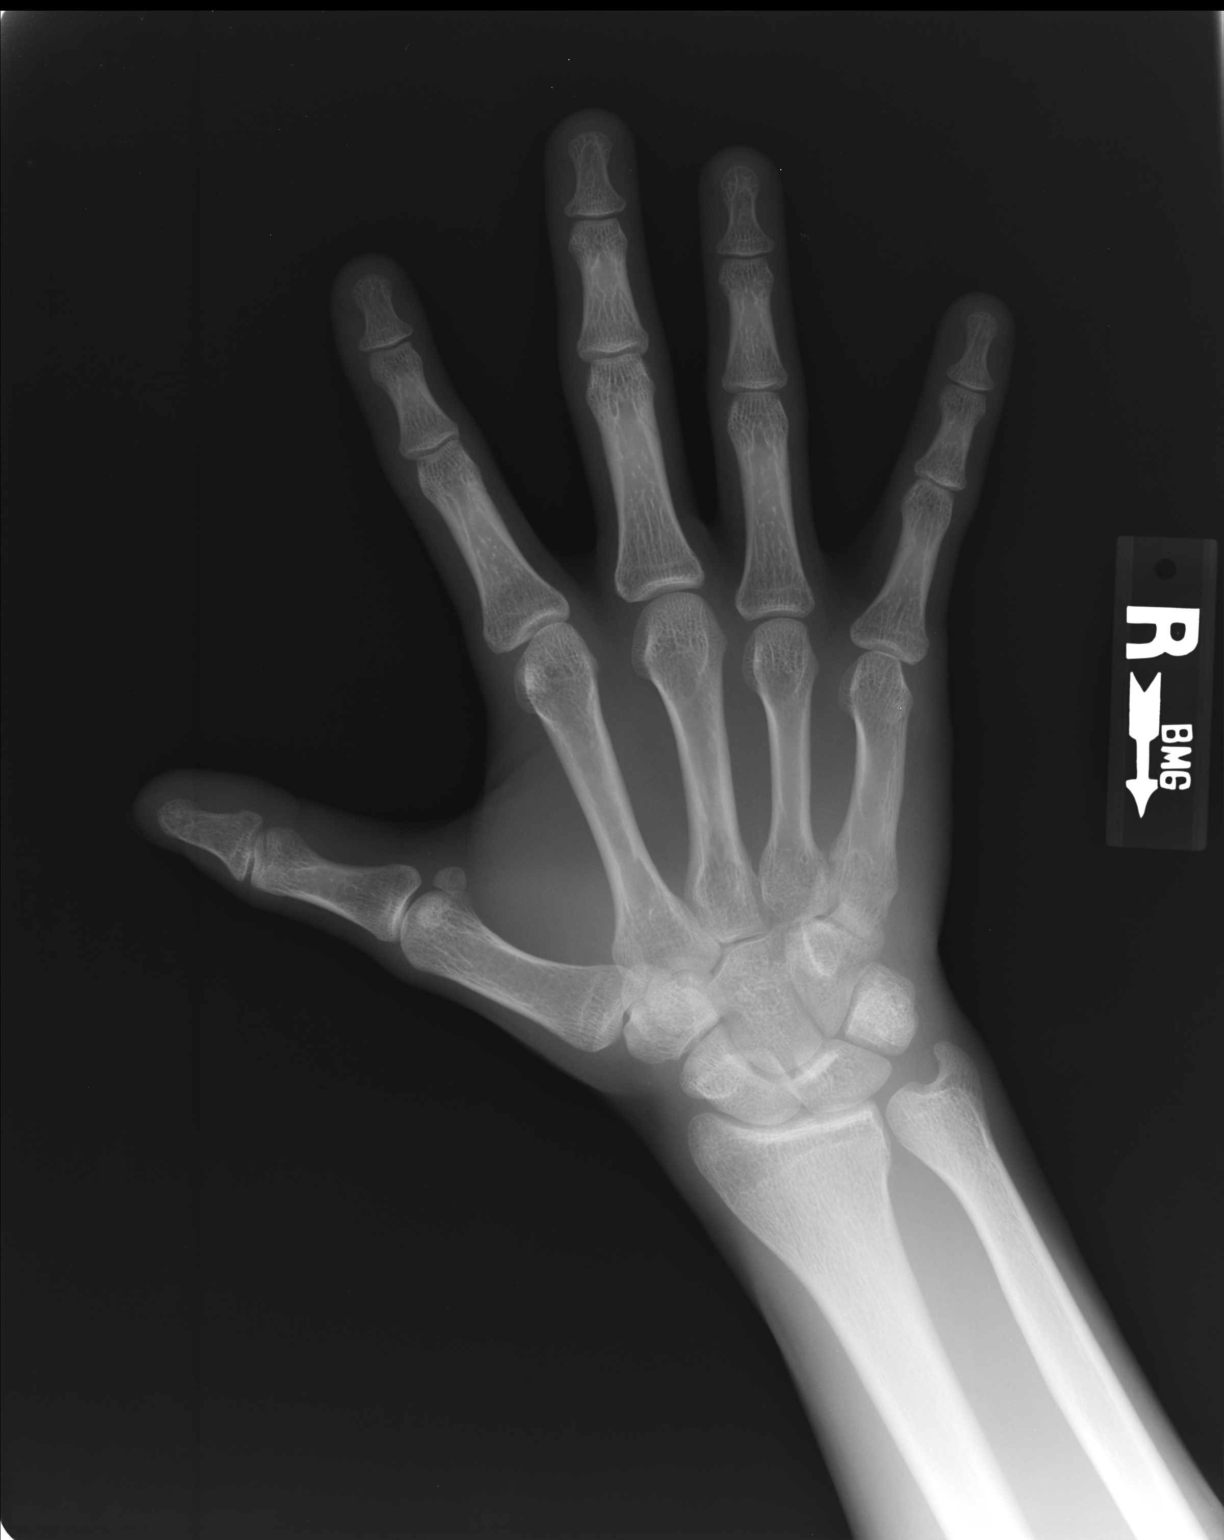

[pa obl]
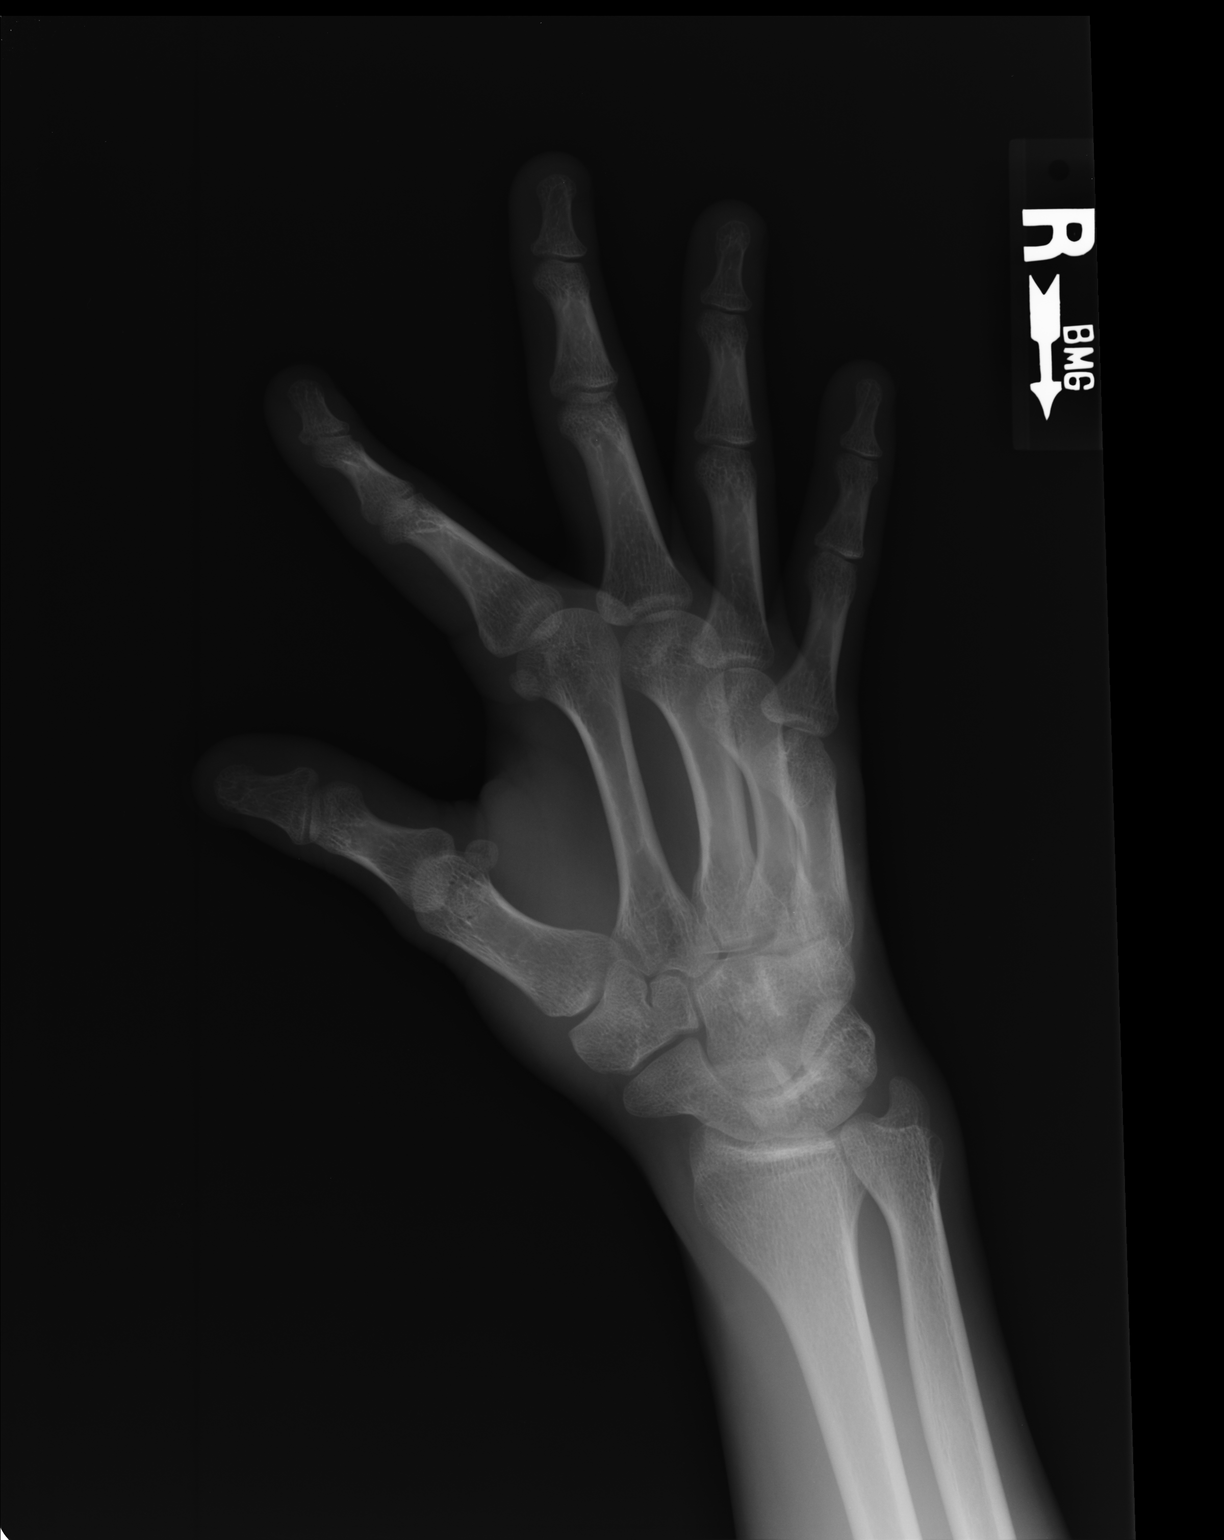

[lateral]
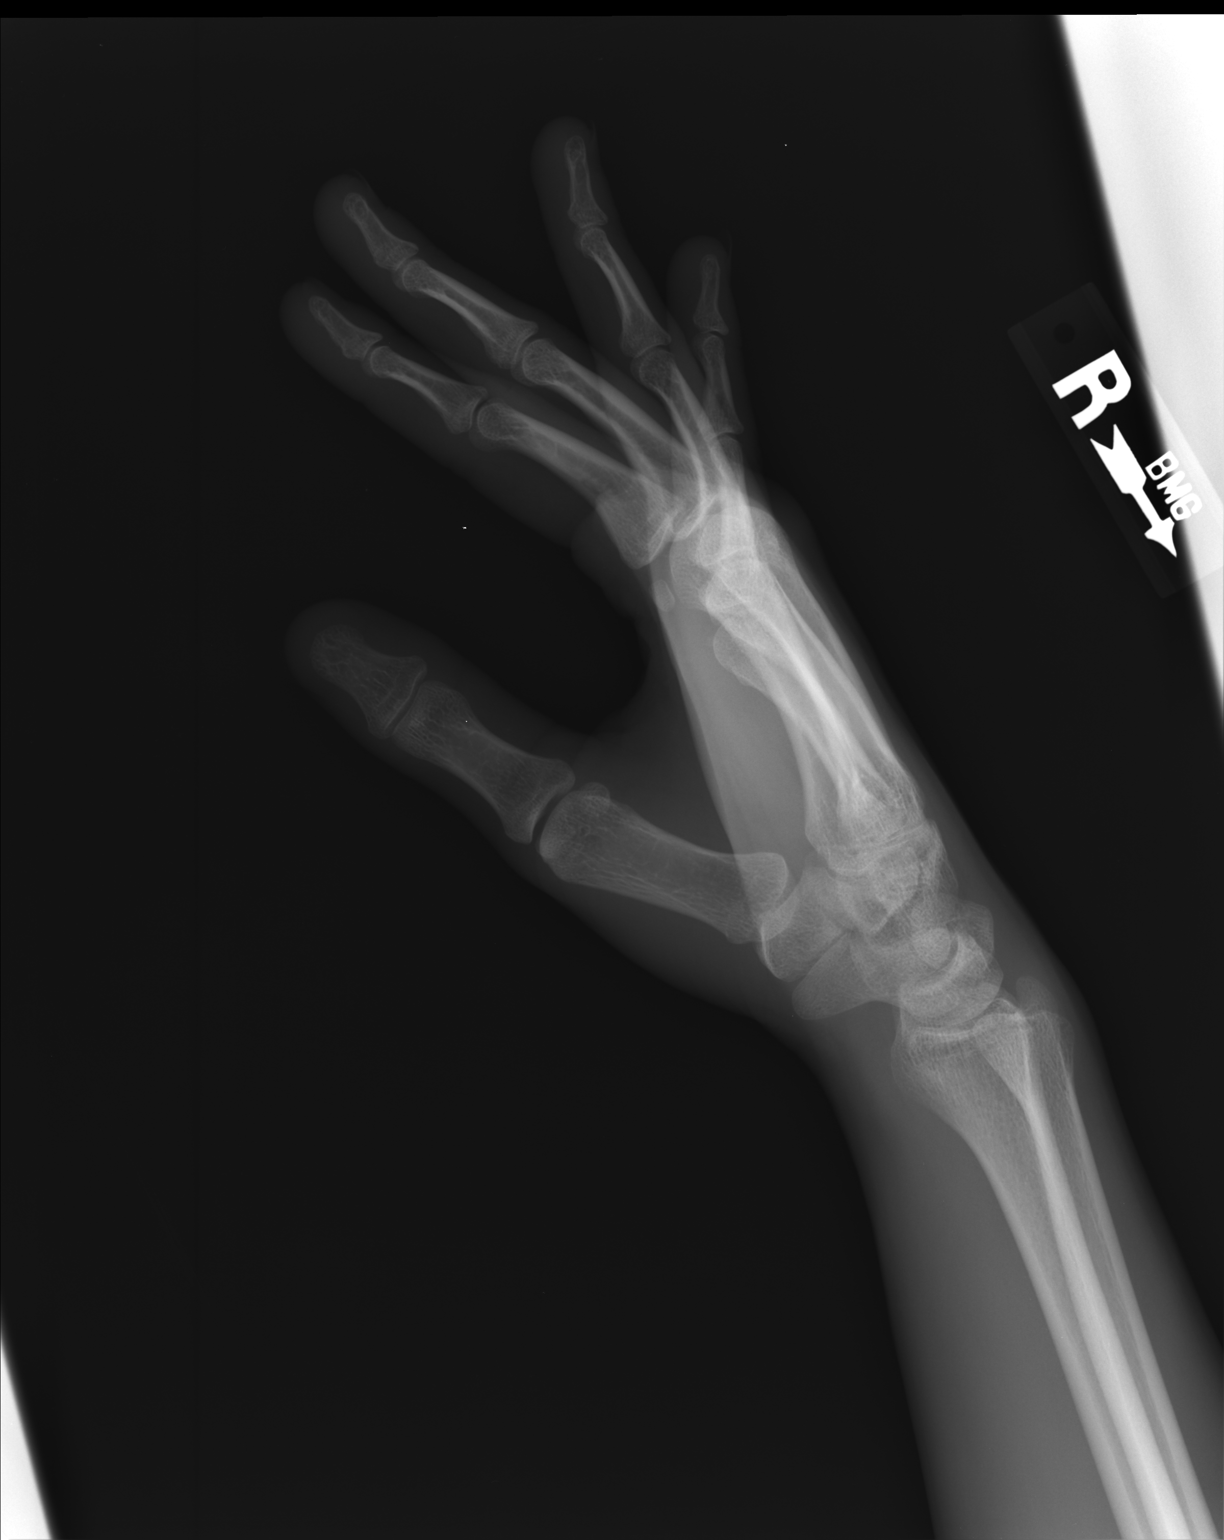

[3 of 3 positions shown; findings below may reference images not displayed]

FINDINGS: There is no evidence of fracture or dislocation. There is no
evidence of arthropathy or other focal bone abnormality. Soft
tissues are unremarkable.
IMPRESSION: Negative.
